# Patient Record
Sex: Male | Born: 1960 | Race: White | Hispanic: No | Marital: Married | Smoking: Never smoker
Health system: Southern US, Community
[De-identification: ages and names within clinical notes are randomized; demographics above are authoritative.]

## PROBLEM LIST (undated history)

## (undated) DIAGNOSIS — G4733 Obstructive sleep apnea (adult) (pediatric): Secondary | ICD-10-CM

## (undated) DIAGNOSIS — J45909 Unspecified asthma, uncomplicated: Secondary | ICD-10-CM

## (undated) DIAGNOSIS — I10 Essential (primary) hypertension: Secondary | ICD-10-CM

## (undated) HISTORY — PX: OTHER SURGICAL HISTORY: SHX169

## (undated) HISTORY — DX: Unspecified asthma, uncomplicated: J45.909

## (undated) HISTORY — DX: Obstructive sleep apnea (adult) (pediatric): G47.33

---

## 2010-05-07 HISTORY — PX: OTHER SURGICAL HISTORY: SHX169

## 2018-11-21 ENCOUNTER — Other Ambulatory Visit: Payer: Self-pay

## 2018-11-21 ENCOUNTER — Encounter (HOSPITAL_COMMUNITY): Payer: Self-pay

## 2018-11-21 ENCOUNTER — Emergency Department (HOSPITAL_COMMUNITY): Payer: 59

## 2018-11-21 ENCOUNTER — Emergency Department (HOSPITAL_COMMUNITY)
Admission: EM | Admit: 2018-11-21 | Discharge: 2018-11-21 | Disposition: A | Discharge status: Home / Self Care | Payer: 59 | Attending: Emergency Medicine | Admitting: Emergency Medicine

## 2018-11-21 ENCOUNTER — Other Ambulatory Visit: Payer: Self-pay | Admitting: Urology

## 2018-11-21 DIAGNOSIS — R109 Unspecified abdominal pain: Secondary | ICD-10-CM

## 2018-11-21 DIAGNOSIS — I10 Essential (primary) hypertension: Secondary | ICD-10-CM | POA: Insufficient documentation

## 2018-11-21 DIAGNOSIS — N1330 Unspecified hydronephrosis: Secondary | ICD-10-CM

## 2018-11-21 DIAGNOSIS — Z79899 Other long term (current) drug therapy: Secondary | ICD-10-CM | POA: Diagnosis not present

## 2018-11-21 DIAGNOSIS — N202 Calculus of kidney with calculus of ureter: Secondary | ICD-10-CM | POA: Diagnosis not present

## 2018-11-21 DIAGNOSIS — R1032 Left lower quadrant pain: Secondary | ICD-10-CM | POA: Diagnosis present

## 2018-11-21 DIAGNOSIS — N200 Calculus of kidney: Secondary | ICD-10-CM

## 2018-11-21 HISTORY — DX: Essential (primary) hypertension: I10

## 2018-11-21 LAB — CBC WITH DIFFERENTIAL/PLATELET
Abs Immature Granulocytes: 0.06 10*3/uL (ref 0.00–0.07)
Basophils Absolute: 0.1 10*3/uL (ref 0.0–0.1)
Basophils Relative: 1 %
Eosinophils Absolute: 0.2 10*3/uL (ref 0.0–0.5)
Eosinophils Relative: 2 %
HCT: 46.8 % (ref 39.0–52.0)
Hemoglobin: 15.5 g/dL (ref 13.0–17.0)
Immature Granulocytes: 1 %
Lymphocytes Relative: 32 %
Lymphs Abs: 3.3 10*3/uL (ref 0.7–4.0)
MCH: 31.7 pg (ref 26.0–34.0)
MCHC: 33.1 g/dL (ref 30.0–36.0)
MCV: 95.7 fL (ref 80.0–100.0)
Monocytes Absolute: 1.1 10*3/uL — ABNORMAL HIGH (ref 0.1–1.0)
Monocytes Relative: 11 %
Neutro Abs: 5.5 10*3/uL (ref 1.7–7.7)
Neutrophils Relative %: 53 %
Platelets: 287 10*3/uL (ref 150–400)
RBC: 4.89 MIL/uL (ref 4.22–5.81)
RDW: 11.5 % (ref 11.5–15.5)
WBC: 10.2 10*3/uL (ref 4.0–10.5)
nRBC: 0 % (ref 0.0–0.2)

## 2018-11-21 LAB — COMPREHENSIVE METABOLIC PANEL
ALT: 56 U/L — ABNORMAL HIGH (ref 0–44)
AST: 28 U/L (ref 15–41)
Albumin: 4.1 g/dL (ref 3.5–5.0)
Alkaline Phosphatase: 73 U/L (ref 38–126)
Anion gap: 11 (ref 5–15)
BUN: 20 mg/dL (ref 6–20)
CO2: 27 mmol/L (ref 22–32)
Calcium: 9.5 mg/dL (ref 8.9–10.3)
Chloride: 102 mmol/L (ref 98–111)
Creatinine, Ser: 1.75 mg/dL — ABNORMAL HIGH (ref 0.61–1.24)
GFR calc Af Amer: 49 mL/min — ABNORMAL LOW (ref >60)
GFR calc non Af Amer: 43 mL/min — ABNORMAL LOW (ref >60)
Glucose, Bld: 90 mg/dL (ref 70–99)
Potassium: 3.7 mmol/L (ref 3.5–5.1)
Sodium: 140 mmol/L (ref 135–145)
Total Bilirubin: 0.6 mg/dL (ref 0.3–1.2)
Total Protein: 6.5 g/dL (ref 6.5–8.1)

## 2018-11-21 LAB — URINALYSIS, ROUTINE W REFLEX MICROSCOPIC
Bacteria, UA: NONE SEEN
Bilirubin Urine: NEGATIVE
Glucose, UA: NEGATIVE mg/dL
Ketones, ur: NEGATIVE mg/dL
Leukocytes,Ua: NEGATIVE
Nitrite: NEGATIVE
Protein, ur: NEGATIVE mg/dL
Specific Gravity, Urine: 1.014 (ref 1.005–1.030)
pH: 6 (ref 5.0–8.0)

## 2018-11-21 LAB — LIPASE, BLOOD: Lipase: 28 U/L (ref 11–51)

## 2018-11-21 MED ORDER — ONDANSETRON HCL 4 MG PO TABS: 4.0000 mg | ORAL_TABLET | Freq: Four times a day (QID) | ORAL | 0 refills | Status: AC

## 2018-11-21 MED ORDER — HYDROCODONE-ACETAMINOPHEN 5-325 MG PO TABS: 2.0000 | ORAL_TABLET | ORAL | 0 refills | Status: DC | PRN

## 2018-11-21 MED ORDER — SODIUM CHLORIDE 0.9 % IV BOLUS
1000.0000 mL | Freq: Once | INTRAVENOUS | Status: AC
  Administered 2018-11-21: 1000 mL via INTRAVENOUS

## 2018-11-21 NOTE — ED Notes (Signed)
Patient transported to CT 

## 2018-11-21 NOTE — ED Triage Notes (Signed)
Pt stated that Saturday the Lt side of his back began hurting at a 3/10 rate & intermittently the pain will increase to 10/10 & it will cause him to feel sick, he has vomited x2 from the intense pain so far. Denies fever & Hx of kidney stone, he states that the pain will radiate around his ribs & down into his groin area.

## 2018-11-21 NOTE — Discharge Instructions (Addendum)
I have prescribed medication to help with your nausea, please take Zofran as needed.  I have also prescribed Norco 5 mg, take this for severe pain as needed.  You may continue to increase your fluid intake.  Your procedure scheduled for Monday morning at 9:30 AM for lithotripsy.  If you experience any worsening symptoms over the weekend such as vomiting, unable to keep your pain under control with medication or fever please return to the emergency department.

## 2018-11-21 NOTE — Consult Note (Signed)
I have been asked to see the patient by Ronald Ritter, for evaluation and management of left mid-ureteral stone.  History of present illness: [name] seen in the ED today for two weeks of left flank pain.  Patient with intermittent episodes of severe pain and nausea/vomitting.  No fevers/chills.  No dysuria.  No hematuria.  No history of stones.    In ED patient had CT scan showing a 64mm proximal left ureteral stone with hydronephrosis.  Creatinine was noted to be elevated.  He had no evidence of infection.  With pain medication his pain was able to be controlled.  Review of systems: A 12 point comprehensive review of systems was obtained and is negative unless otherwise stated in the history of present illness.  There are no active problems to display for this patient.   No current facility-administered medications on file prior to encounter.    Current Outpatient Medications on File Prior to Encounter  Medication Sig Dispense Refill  . albuterol (VENTOLIN HFA) 108 (90 Base) MCG/ACT inhaler Inhale 2 puffs into the lungs every 6 (six) hours as needed for wheezing or shortness of breath.    Marland Kitchen amLODipine (NORVASC) 10 MG tablet Take 10 mg by mouth daily.    Marland Kitchen ibuprofen (ADVIL) 200 MG tablet Take 400 mg by mouth every 6 (six) hours as needed for moderate pain.    Marland Kitchen losartan (COZAAR) 50 MG tablet Take 50 mg by mouth daily.      Past Medical History:  Diagnosis Date  . Hypertension     Past Surgical History:  Procedure Laterality Date  . Bone spur removal in neck  2012  . CPPP     To assist in his sleep apnea    Social History   Tobacco Use  . Smoking status: Never Smoker  . Smokeless tobacco: Never Used  Substance Use Topics  . Alcohol use: Yes    Alcohol/week: 2.0 standard drinks    Types: 2 Cans of beer per week  . Drug use: Not on file    Family History  Problem Relation Age of Onset  . Diabetes Father     PE: Vitals:   11/21/18 1129 11/21/18 1130 11/21/18 1200  11/21/18 1354  BP:  120/80 126/89 134/86  Pulse: 61 60 61 66  Resp:   19 14  Temp:      TempSrc:      SpO2: 95% 96% 95% 96%  Weight:      Height:       Patient appears to be in no acute distress  patient is alert and oriented x3 Atraumatic normocephalic head No cervical or supraclavicular lymphadenopathy appreciated No increased work of breathing, no audible wheezes/rhonchi Regular sinus rhythm/rate Abdomen is soft, nontender, nondistended, LEFT CVA tenderness Lower extremities are symmetric without appreciable edema Grossly neurologically intact No identifiable skin lesions  Recent Labs    11/21/18 0823  WBC 10.2  HGB 15.5  HCT 46.8   Recent Labs    11/21/18 0823  NA 140  K 3.7  CL 102  CO2 27  GLUCOSE 90  BUN 20  CREATININE 1.75*  CALCIUM 9.5   No results for input(s): LABPT, INR in the last 72 hours. No results for input(s): LABURIN in the last 72 hours. No results found for this or any previous visit.  Imaging: I have independently reviewed his CT scan images and discussed them with the patient.  My findings are in the HPI.  KUB - 70mm stone just  lateral to L4.  Imp: Left obstructing ureteral stone with associated AKI.  Recommendations: We discussed management options including medical expulsion therapy, shockwave lithotripsy, and ureteroscopy. Ultimately, the patient has opted for shock wave lithotripsy. I discussed with the patient the procedure in detail as well as the risk and benefits. The patient is aware that she may need additional procedures. She also is aware of the risks of hematoma and pain.  We will try to get this patient's scheduled as soon as possible.   I have scheduled him for ESWL on Monday.  I went over all the instructions with him.  He will stop by our office on the way home today to pick up the information packet.  The ED provider will give him a prescription for pain medication.  Ronald Ritter

## 2018-11-21 NOTE — ED Provider Notes (Signed)
[facility] EMERGENCY DEPARTMENT Provider Note   CSN: [id number] Arrival date & time: 11/21/18  0740    History   Chief Complaint Chief Complaint  Patient presents with   Back Pain    HPI Ronald Ritter is a 58 y.o. male.     58 y.o male with a PMH of HTN presents to the ED with a chief complaint of left flank pain which began 6 days ago.  Patient describes this pain as a sharp shooting sensation radiating from his left flank onto his left groin.  He reports this pain has been waxing and waning, came back overnight waking him up from his sleep.  No alleviating or exacerbating factors.  Patient was seen at urgent care yesterday, referred to the ED for a CT scan as a likely thought he had a kidney stone.  He reports the pain returns intense, causes him to vomit has vomited twice due to the pain.  He has been taking his wife's muscle relaxers along with NSAIDs to help with his pain, he reports alleviating symptoms with this medication.  He was told there was some microscopic hematuria at urgent care in his urine, does not note his urine to be visibly bloody.  He denies any fevers, diarrhea, dysuria, shortness of breath or chest pain. Of note patient has a past surgical history of multiple hernia repairs.     The history is provided by the patient.  Back Pain Associated symptoms: no abdominal pain, no chest pain, no dysuria and no fever     Past Medical History:  Diagnosis Date   Hypertension     There are no active problems to display for this patient.   Home Medications    Prior to Admission medications   Medication Sig Start Date End Date Taking? Authorizing Provider  albuterol (VENTOLIN HFA) 108 (90 Base) MCG/ACT inhaler Inhale 2 puffs into the lungs every 6 (six) hours as needed for wheezing or shortness of breath.   Yes [provider]  amLODipine (NORVASC) 10 MG tablet Take 10 mg by mouth daily.   Yes [provider]  ibuprofen  (ADVIL) 200 MG tablet Take 400 mg by mouth every 6 (six) hours as needed for moderate pain.   Yes [provider]  losartan (COZAAR) 50 MG tablet Take 50 mg by mouth daily.   Yes [provider]    Family History Family History  Problem Relation Age of Onset   Diabetes Father     Social History Social History   Tobacco Use   Smoking status: Never Smoker   Smokeless tobacco: Never Used  Substance Use Topics   Alcohol use: Yes    Alcohol/week: 2.0 standard drinks    Types: 2 Cans of beer per week   Drug use: Not on file     Allergies   Honey bee treatment [bee venom] and Other   Review of Systems Review of Systems  Constitutional: Negative for chills and fever.  HENT: Negative for ear pain and sore throat.   Eyes: Negative for pain and visual disturbance.  Respiratory: Negative for cough and shortness of breath.   Cardiovascular: Negative for chest pain and palpitations.  Gastrointestinal: Positive for nausea and vomiting. Negative for abdominal pain.  Genitourinary: Positive for flank pain and hematuria. Negative for dysuria.  Musculoskeletal: Positive for back pain. Negative for arthralgias.  Skin: Negative for color change and rash.  Neurological: Negative for seizures and syncope.  All other systems reviewed  and are negative.    Physical Exam Updated Vital Signs BP 126/89 (BP Location: Right Arm)    Pulse 61    Temp 98.1 F (36.7 C) (Oral)    Resp 19    Ht 6\' 1"  (1.854 m)    Wt 104.3 kg    SpO2 95%    BMI 30.34 kg/m   Physical Exam Vitals signs and nursing note reviewed.  Constitutional:      Appearance: He is well-developed.     Comments: Appears in no distress during evaluation.  HENT:     Head: Normocephalic and atraumatic.  Eyes:     General: No scleral icterus.    Pupils: Pupils are equal, round, and reactive to light.  Neck:     Musculoskeletal: Normal range of motion.  Cardiovascular:     Rate and Rhythm: Normal rate.      Heart sounds: Normal heart sounds.  Pulmonary:     Effort: Pulmonary effort is normal.     Breath sounds: Normal breath sounds. No wheezing.     Comments: Lungs are clear to auscultation. Chest:     Chest wall: No tenderness.  Abdominal:     General: Bowel sounds are normal. There is no distension.     Palpations: Abdomen is soft.     Tenderness: There is no abdominal tenderness. There is left CVA tenderness.     Comments: Bowel sounds present, abdomen appears nondistended, nontender.  Mild left CVA noted on exam.  Musculoskeletal:        General: No tenderness or deformity.  Skin:    General: Skin is warm and dry.  Neurological:     Mental Status: He is alert and oriented to person, place, and time.      ED Treatments / Results  Labs (all labs ordered are listed, but only abnormal results are displayed) Labs Reviewed  CBC WITH DIFFERENTIAL/PLATELET - Abnormal; Notable for the following components:      Result Value   Monocytes Absolute 1.1 (*)    All other components within normal limits  COMPREHENSIVE METABOLIC PANEL - Abnormal; Notable for the following components:   Creatinine, Ser 1.75 (*)    ALT 56 (*)    GFR calc non Af Amer 43 (*)    GFR calc Af Amer 49 (*)    All other components within normal limits  URINALYSIS, ROUTINE W REFLEX MICROSCOPIC - Abnormal; Notable for the following components:   Hgb urine dipstick MODERATE (*)    All other components within normal limits  LIPASE, BLOOD    EKG None  Radiology Ct Renal Stone Study  Result Date: 11/21/2018 CLINICAL DATA:  Left-sided flank pain EXAM: CT ABDOMEN AND PELVIS WITHOUT CONTRAST TECHNIQUE: Multidetector CT imaging of the abdomen and pelvis was performed following the standard protocol without oral or IV contrast. COMPARISON:  None. FINDINGS: Lower chest: Lung bases are clear. Hepatobiliary: Liver measures 21.8 cm in length. There is hepatic steatosis. No focal liver lesions are evident on this noncontrast  enhanced study. Gallbladder wall is not appreciably thickened. There is no biliary duct dilatation. Pancreas: No pancreatic mass or inflammatory focus evident. Spleen: No splenic lesions are evident. Adrenals/Urinary Tract: Adrenals bilaterally appear normal. There is a suspected upper pole calyceal diverticulum on the left, measuring approximately 3 x 3 cm. There is a 1.3 x 1.3 cm mass arising from the posterior lower pole of the left kidney with a measured Hounsfield unit of 79, felt to represent a small hyperdense cyst.  There is mild hydronephrosis on the left. There is no hydronephrosis on the right. There is a 4 mm calculus in the lower pole right kidney. There are tiny scattered 1 mm calculi in the right kidney. There are two adjacent 1 mm calculi in the upper pole of the left kidney. There is a calculus in the proximal left ureter slightly beyond the ureteropelvic junction at the L4 level measuring 8 x 6 mm. No other ureteral calculi are evident on either side. Urinary bladder wall thickness is within normal limits for degree of distention. Stomach/Bowel: There is no appreciable bowel wall or mesenteric thickening. There is moderate stool throughout the colon. Terminal ileum appears normal. There is no evident bowel obstruction. There is no free air or portal venous air. Vascular/Lymphatic: There are scattered foci mesenteric artery calcification. No abdominal aortic aneurysm. Multiple pelvic phleboliths noted. No adenopathy is demonstrable in the abdomen or pelvis. Reproductive: Prostate and seminal vesicles are normal in size and contour. There are a few prostatic calculi. No pelvic mass. Other: Appendix appears normal. There is no abscess or ascites in the abdomen or pelvis. Musculoskeletal: There are no appreciable blastic or lytic bone lesions. There are foci of degenerative change in the lower thoracic and lumbar regions. No evident abdominal wall or intramuscular lesions. IMPRESSION: 1. 8 x 6 mm  calculus in the proximal left ureter at the L4 level with mild hydronephrosis on the left. 2. 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 Apparent hyperdense cyst lower pole left kidney laterally measuring 1.3 x 1.3 cm. 3. Small nonobstructing calculi in each kidney. Occasional prostatic calculi noted. 4. No evident bowel obstruction. No abscess in the abdomen or pelvis. Appendix appears normal. 5.  Prominent liver with hepatic steatosis. Electronically Signed   By: Bretta BangWilliam  Woodruff III M.D.   On: 11/21/2018 09:12    Procedures Procedures (including critical care time)  Medications Ordered in ED Medications  sodium chloride 0.9 % bolus 1,000 mL (0 mLs Intravenous Stopped 11/21/18 1129)     Initial Impression / Assessment and Plan / ED Course  I have reviewed the triage vital signs and the nursing notes.  Pertinent labs & imaging results that were available during my care of the patient were reviewed by me and considered in my medical decision making (see chart for details).    Patient with no past medical history presents to the ED with complaints of left flank pain which began 6 days ago.  Was seen in urgent care yesterday, told that he likely had a kidney stone will need further evaluation with CT imaging in the ED.  Has been taking muscle relaxers along with NSAIDs to help with his pain with improvement.  Pain waxes and wanes, currently pain is at a 3, did not want any pain medication at this time.  Will provide him with fluids, screening lab work along with CT renal study to further evaluate this.  Differential diagnosis include but not limited to nephrolithiasis, infected stone, diverticulitis.  CMP showed no electrolyte abnormality.  Mild AKI at 1.75 without any prior levels for comparison.  LFTs show slight increase on ALT at 56.  CBC showed no leukocytosis, hemoglobin is within normal limits.  Lipase is unremarkable.    CT renal study showed: 1. 8 x 6 mm calculus in  the proximal left ureter at the L4 level  with mild hydronephrosis on the left.    2. 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  Apparent  hyperdense cyst lower pole left kidney laterally measuring  1.3 x 1.3 cm.    3. Small nonobstructing calculi in each kidney. Occasional prostatic  calculi noted.    4. No evident bowel obstruction. No abscess in the abdomen or  pelvis. Appendix appears normal.    5. Prominent liver with hepatic steatosis.     Will place for for urology recommendations, patient's pain is controlled right now without any therapy while in the ED.  Did receive a liter of fluids.  10:55 AM Spoke to Dr. Hyman HopesWebb of nephrology per patient's request, he has been updated on his case as his wife is currently employed with him. Will waiting on urology recommendations.   12:12 PM Spoke to Dr. Marlou PorchHerrick of Urology who reviewed CT imaging and can schedule patient for lithotripsy on Monday.  He requested a KUB be order, order in place.  He will come into the ED to further discuss treatment and procedure with patient.  Patient updated on this plan.  Patient is to be sent home with Zofran along with Norco to help with his symptoms, he is scheduled for lithotripsy at 930 on Monday morning.  Patient understands and agrees with plan.  With unremarkable vitals, stable for discharge.  Return precautions discussed at length.   Portions of this note were generated with Scientist, clinical (histocompatibility and immunogenetics)Dragon dictation software. Dictation errors may occur despite best attempts at proofreading.   Final Clinical Impressions(s) / ED Diagnoses   Final diagnoses:  Left flank pain  Left nephrolithiasis  Hydronephrosis of left kidney    ED Discharge Orders    None       Claude MangesSoto, Ollen Rao, PA-C 11/21/18 1402    Pricilla LovelessGoldston, Scott, MD 11/25/18 [phone]

## 2018-11-21 NOTE — H&P (View-Only) (Signed)
I have been asked to see the patient by Dr. Sherwood Gambler, for evaluation and management of left mid-ureteral stone.  History of present illness: [name] seen in the ED today for two weeks of left flank pain.  Patient with intermittent episodes of severe pain and nausea/vomitting.  No fevers/chills.  No dysuria.  No hematuria.  No history of stones.    In ED patient had CT scan showing a 64mm proximal left ureteral stone with hydronephrosis.  Creatinine was noted to be elevated.  He had no evidence of infection.  With pain medication his pain was able to be controlled.  Review of systems: A 12 point comprehensive review of systems was obtained and is negative unless otherwise stated in the history of present illness.  There are no active problems to display for this patient.   No current facility-administered medications on file prior to encounter.    Current Outpatient Medications on File Prior to Encounter  Medication Sig Dispense Refill  . albuterol (VENTOLIN HFA) 108 (90 Base) MCG/ACT inhaler Inhale 2 puffs into the lungs every 6 (six) hours as needed for wheezing or shortness of breath.    Marland Kitchen amLODipine (NORVASC) 10 MG tablet Take 10 mg by mouth daily.    Marland Kitchen ibuprofen (ADVIL) 200 MG tablet Take 400 mg by mouth every 6 (six) hours as needed for moderate pain.    Marland Kitchen losartan (COZAAR) 50 MG tablet Take 50 mg by mouth daily.      Past Medical History:  Diagnosis Date  . Hypertension     Past Surgical History:  Procedure Laterality Date  . Bone spur removal in neck  2012  . CPPP     To assist in his sleep apnea    Social History   Tobacco Use  . Smoking status: Never Smoker  . Smokeless tobacco: Never Used  Substance Use Topics  . Alcohol use: Yes    Alcohol/week: 2.0 standard drinks    Types: 2 Cans of beer per week  . Drug use: Not on file    Family History  Problem Relation Age of Onset  . Diabetes Father     PE: Vitals:   11/21/18 1129 11/21/18 1130 11/21/18 1200  11/21/18 1354  BP:  120/80 126/89 134/86  Pulse: 61 60 61 66  Resp:   19 14  Temp:      TempSrc:      SpO2: 95% 96% 95% 96%  Weight:      Height:       Patient appears to be in no acute distress  patient is alert and oriented x3 Atraumatic normocephalic head No cervical or supraclavicular lymphadenopathy appreciated No increased work of breathing, no audible wheezes/rhonchi Regular sinus rhythm/rate Abdomen is soft, nontender, nondistended, LEFT CVA tenderness Lower extremities are symmetric without appreciable edema Grossly neurologically intact No identifiable skin lesions  Recent Labs    11/21/18 0823  WBC 10.2  HGB 15.5  HCT 46.8   Recent Labs    11/21/18 0823  NA 140  K 3.7  CL 102  CO2 27  GLUCOSE 90  BUN 20  CREATININE 1.75*  CALCIUM 9.5   No results for input(s): LABPT, INR in the last 72 hours. No results for input(s): LABURIN in the last 72 hours. No results found for this or any previous visit.  Imaging: I have independently reviewed his CT scan images and discussed them with the patient.  My findings are in the HPI.  KUB - 70mm stone just  lateral to L4.  Imp: Left obstructing ureteral stone with associated AKI.  Recommendations: We discussed management options including medical expulsion therapy, shockwave lithotripsy, and ureteroscopy. Ultimately, the patient has opted for shock wave lithotripsy. I discussed with the patient the procedure in detail as well as the risk and benefits. The patient is aware that she may need additional procedures. She also is aware of the risks of hematoma and pain.  We will try to get this patient's scheduled as soon as possible.   I have scheduled him for ESWL on Monday.  I went over all the instructions with him.  He will stop by our office on the way home today to pick up the information packet.  The ED provider will give him a prescription for pain medication.  Ivett Luebbe W Kaiyon Hynes  

## 2018-11-22 ENCOUNTER — Other Ambulatory Visit: Payer: Self-pay | Admitting: Urology

## 2018-11-22 DIAGNOSIS — N200 Calculus of kidney: Secondary | ICD-10-CM

## 2018-11-22 MED ORDER — HYDROCODONE-ACETAMINOPHEN 5-325 MG PO TABS: 1.0000 | ORAL_TABLET | ORAL | 0 refills | Status: DC | PRN

## 2018-11-23 ENCOUNTER — Other Ambulatory Visit: Payer: Self-pay | Admitting: Urology

## 2018-11-23 MED ORDER — HYDROCODONE-ACETAMINOPHEN 5-325 MG PO TABS: 1.0000 | ORAL_TABLET | ORAL | 0 refills | Status: DC | PRN

## 2018-11-24 ENCOUNTER — Ambulatory Visit (HOSPITAL_COMMUNITY): Payer: 59

## 2018-11-24 ENCOUNTER — Other Ambulatory Visit: Payer: Self-pay

## 2018-11-24 ENCOUNTER — Encounter (HOSPITAL_COMMUNITY)
Admission: RE | Disposition: A | Discharge status: Home / Self Care | Payer: Self-pay | Source: Home / Self Care | Attending: Urology

## 2018-11-24 ENCOUNTER — Encounter (HOSPITAL_COMMUNITY): Payer: Self-pay

## 2018-11-24 ENCOUNTER — Ambulatory Visit (HOSPITAL_COMMUNITY)
Admission: RE | Admit: 2018-11-24 | Discharge: 2018-11-24 | Disposition: A | Discharge status: Home / Self Care | Payer: 59 | Attending: Urology | Admitting: Urology

## 2018-11-24 DIAGNOSIS — I10 Essential (primary) hypertension: Secondary | ICD-10-CM | POA: Diagnosis not present

## 2018-11-24 DIAGNOSIS — N132 Hydronephrosis with renal and ureteral calculous obstruction: Secondary | ICD-10-CM | POA: Insufficient documentation

## 2018-11-24 DIAGNOSIS — N179 Acute kidney failure, unspecified: Secondary | ICD-10-CM | POA: Diagnosis not present

## 2018-11-24 DIAGNOSIS — R109 Unspecified abdominal pain: Secondary | ICD-10-CM | POA: Diagnosis present

## 2018-11-24 DIAGNOSIS — N201 Calculus of ureter: Secondary | ICD-10-CM

## 2018-11-24 HISTORY — PX: EXTRACORPOREAL SHOCK WAVE LITHOTRIPSY: SHX1557

## 2018-11-24 SURGERY — LITHOTRIPSY, ESWL
Anesthesia: LOCAL | Laterality: Left

## 2018-11-24 MED ORDER — SODIUM CHLORIDE 0.9 % IV SOLN: 250.0000 mL | INTRAVENOUS | Status: DC | PRN

## 2018-11-24 MED ORDER — SODIUM CHLORIDE 0.9 % IV SOLN
INTRAVENOUS | Status: DC
  Administered 2018-11-24: 11:00:00 via INTRAVENOUS

## 2018-11-24 MED ORDER — DIAZEPAM 5 MG PO TABS
10.0000 mg | ORAL_TABLET | ORAL | Status: AC
  Administered 2018-11-24: 10 mg via ORAL
  Filled 2018-11-24: qty 2

## 2018-11-24 MED ORDER — SODIUM CHLORIDE 0.9% FLUSH: 3.0000 mL | INTRAVENOUS | Status: DC | PRN

## 2018-11-24 MED ORDER — ACETAMINOPHEN 325 MG PO TABS: 650.0000 mg | ORAL_TABLET | ORAL | Status: DC | PRN

## 2018-11-24 MED ORDER — MORPHINE SULFATE (PF) 4 MG/ML IV SOLN
2.0000 mg | INTRAVENOUS | Status: DC | PRN
  Administered 2018-11-24: 2 mg via INTRAVENOUS

## 2018-11-24 MED ORDER — MORPHINE SULFATE (PF) 4 MG/ML IV SOLN
INTRAVENOUS | Status: AC
  Filled 2018-11-24: qty 1

## 2018-11-24 MED ORDER — ACETAMINOPHEN 650 MG RE SUPP
650.0000 mg | RECTAL | Status: DC | PRN
  Filled 2018-11-24: qty 1

## 2018-11-24 MED ORDER — OXYCODONE HCL 5 MG PO TABS
ORAL_TABLET | ORAL | Status: AC
  Filled 2018-11-24: qty 2

## 2018-11-24 MED ORDER — DIPHENHYDRAMINE HCL 25 MG PO CAPS
25.0000 mg | ORAL_CAPSULE | ORAL | Status: AC
  Administered 2018-11-24: 25 mg via ORAL
  Filled 2018-11-24: qty 1

## 2018-11-24 MED ORDER — CIPROFLOXACIN HCL 500 MG PO TABS
500.0000 mg | ORAL_TABLET | ORAL | Status: AC
  Administered 2018-11-24: 11:00:00 500 mg via ORAL
  Filled 2018-11-24: qty 1

## 2018-11-24 MED ORDER — ACETAMINOPHEN 10 MG/ML IV SOLN: 1000.0000 mg | Freq: Four times a day (QID) | INTRAVENOUS | Status: DC

## 2018-11-24 MED ORDER — OXYCODONE-ACETAMINOPHEN 5-325 MG PO TABS: 1.0000 | ORAL_TABLET | ORAL | 0 refills | Status: DC | PRN

## 2018-11-24 MED ORDER — ACETAMINOPHEN 10 MG/ML IV SOLN
INTRAVENOUS | Status: AC
  Filled 2018-11-24: qty 100

## 2018-11-24 MED ORDER — OXYCODONE HCL 5 MG PO TABS
5.0000 mg | ORAL_TABLET | ORAL | Status: DC | PRN
  Administered 2018-11-24: 10 mg via ORAL

## 2018-11-24 MED ORDER — SODIUM CHLORIDE 0.9% FLUSH: 3.0000 mL | Freq: Two times a day (BID) | INTRAVENOUS | Status: DC

## 2018-11-24 NOTE — Progress Notes (Signed)
PACU Phase II  Pt is in pain 10/10 despite 2x 5 Oxy IR and 2 mg morphine IV. Spoke to Dr Jeffie Pollock, got verbal order for 1000 mg Tylenol IV.   Tylenol in route. Pt appears in severe pain pacing in room ,

## 2018-11-24 NOTE — Discharge Instructions (Signed)

## 2018-11-24 NOTE — Interval H&P Note (Signed)
History and Physical Interval Note:  No change in stone position.  He needs pain med.   11/24/2018 11:30 AM  Ronald Ritter  has presented today for surgery, with the diagnosis of LEFT URETERAL STONE.  The various methods of treatment have been discussed with the patient and family. After consideration of risks, benefits and other options for treatment, the patient has consented to  Procedure(s): EXTRACORPOREAL SHOCK WAVE LITHOTRIPSY (ESWL) (Left) as a surgical intervention.  The patient's history has been reviewed, patient examined, no change in status, stable for surgery.  I have reviewed the patient's chart and labs.  Questions were answered to the patient's satisfaction.     Irine Seal

## 2018-11-25 ENCOUNTER — Encounter (HOSPITAL_COMMUNITY): Payer: Self-pay | Admitting: Urology

## 2019-11-29 IMAGING — CR ABDOMEN - 1 VIEW
2 series · 2 of 2 positions shown · non-contrast
Comparison: CT scan November 21, 2018

CLINICAL DATA: Preoperative study prior to lithotripsy

EXAM:
ABDOMEN - 1 VIEW

[t abdomen supine (1 of 2)]
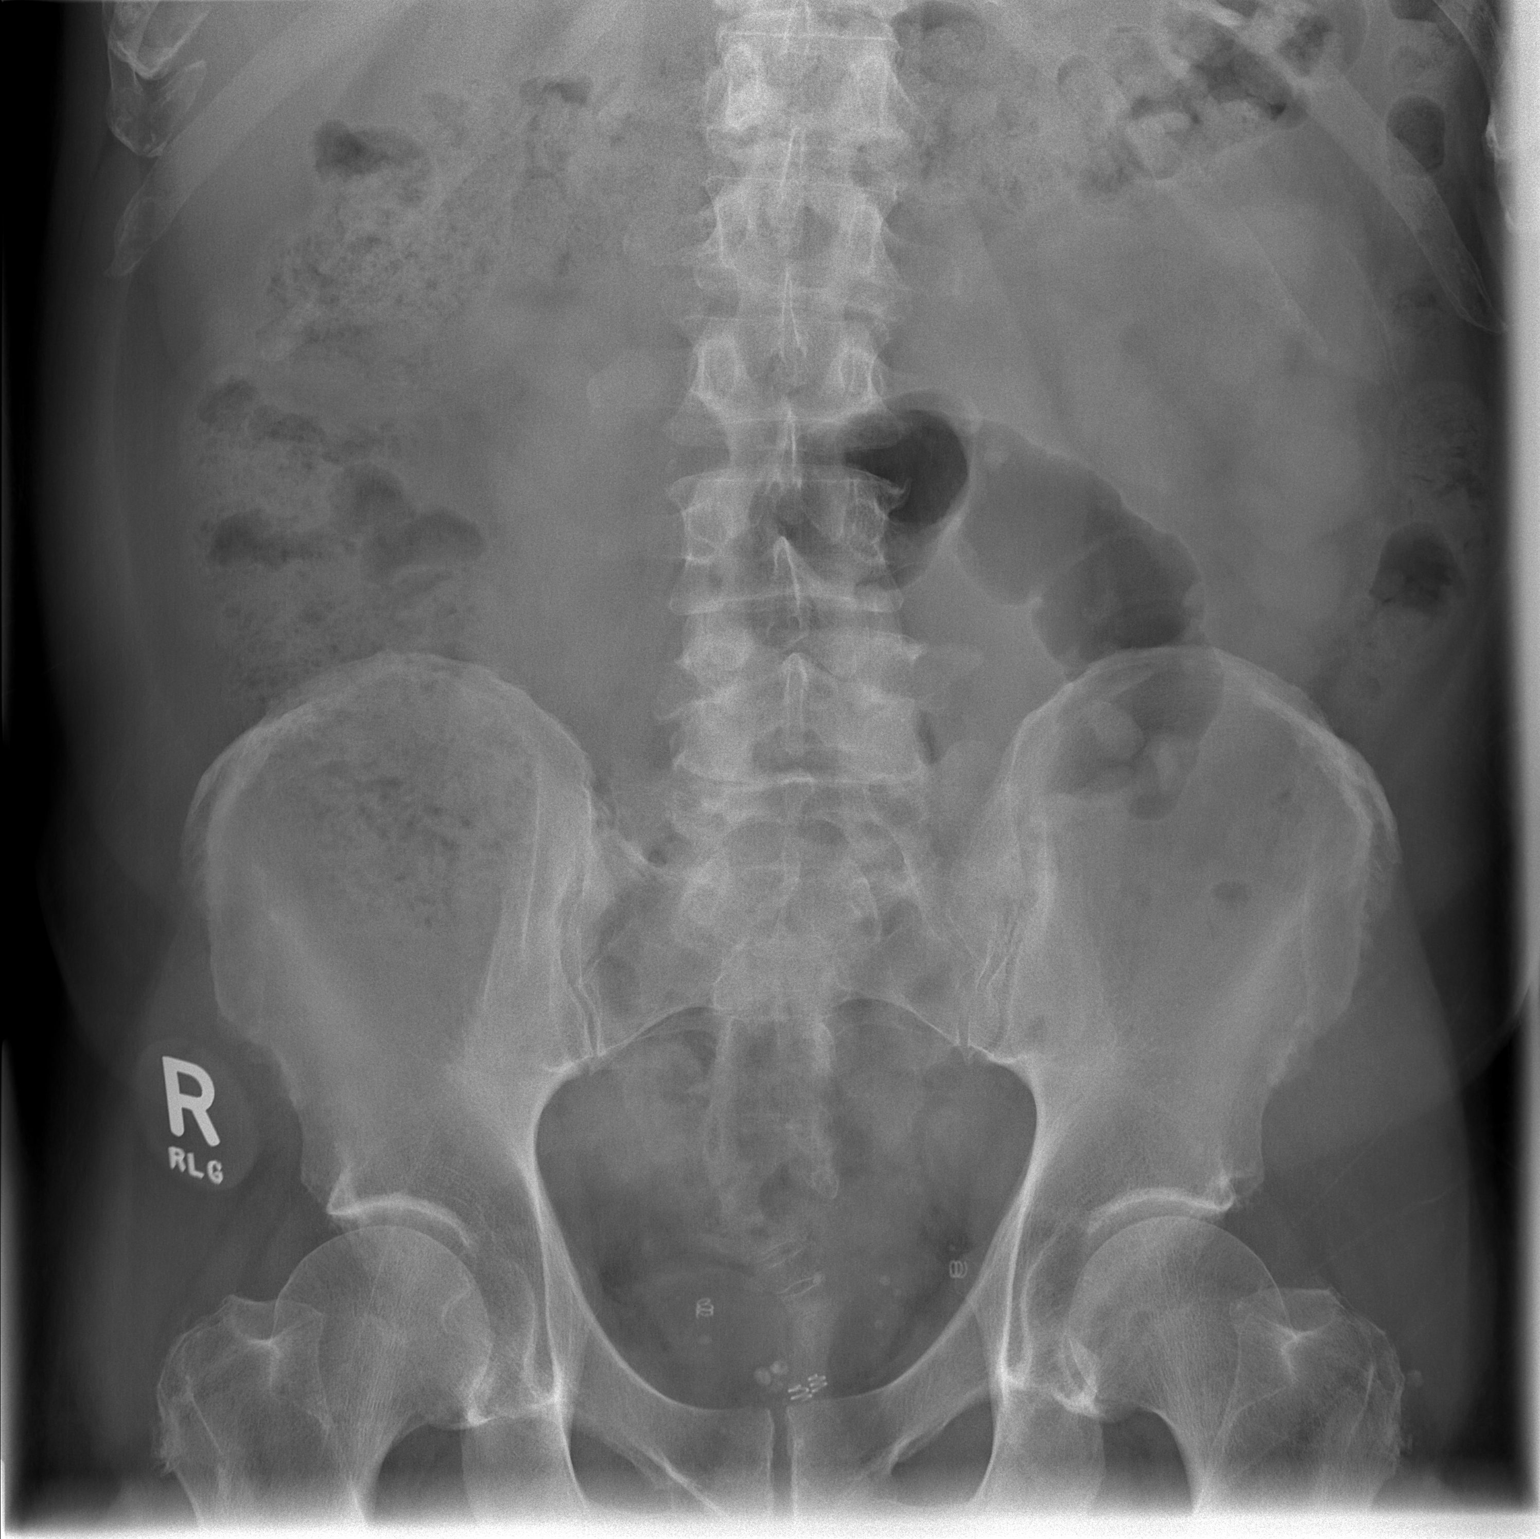

[t abdomen supine (2 of 2)]
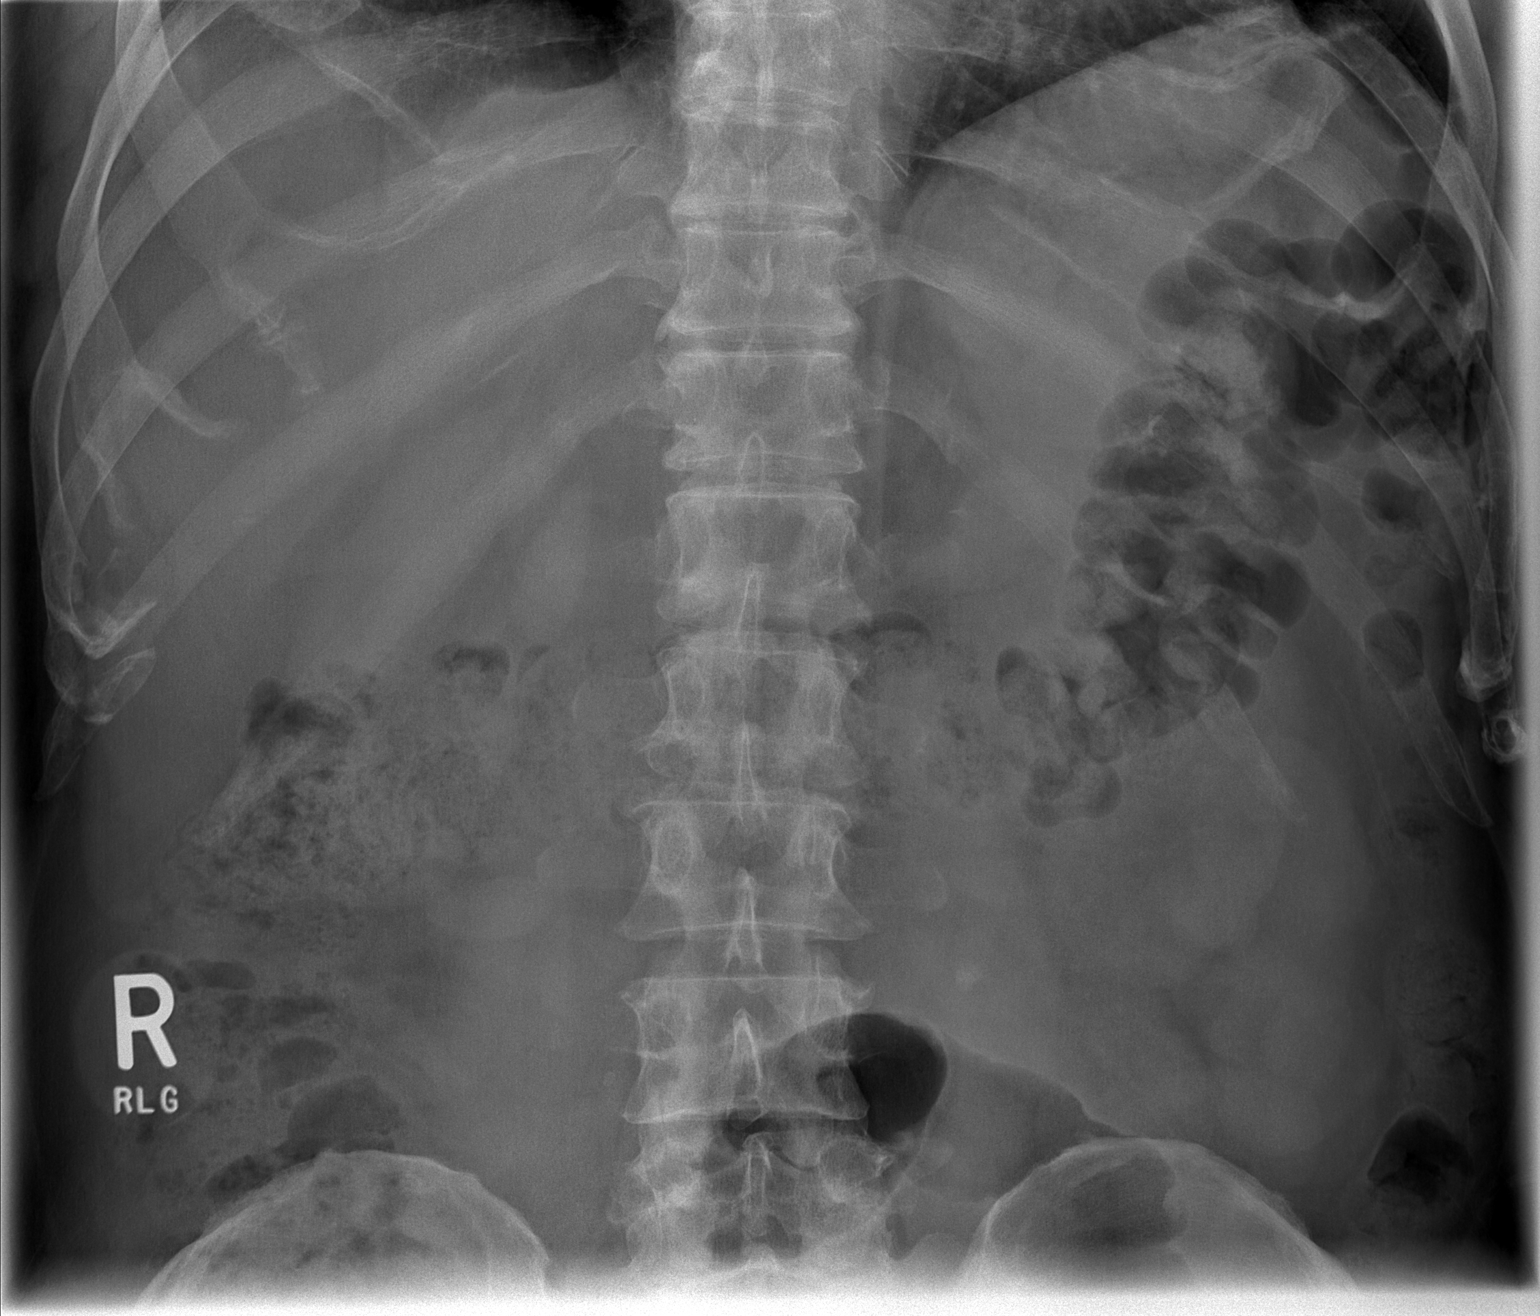

[2 of 2 positions shown; findings below may reference images not displayed]

FINDINGS: The stone in the proximal left ureter is again identified at the
L3-4 level, similar in position. Phleboliths in the pelvis. No other
acute abnormalities.
IMPRESSION: Persistent stone measuring at least 9 mm in the proximal left
ureter.

## 2021-03-07 ENCOUNTER — Encounter: Payer: Self-pay | Admitting: Allergy

## 2021-03-07 ENCOUNTER — Ambulatory Visit: Payer: No Typology Code available for payment source | Admitting: Allergy

## 2021-03-07 ENCOUNTER — Other Ambulatory Visit: Payer: Self-pay

## 2021-03-07 VITALS — BP 138/82 | HR 60 | Ht 72.5 in | Wt 237.0 lb

## 2021-03-07 DIAGNOSIS — J4540 Moderate persistent asthma, uncomplicated: Secondary | ICD-10-CM

## 2021-03-07 DIAGNOSIS — J3089 Other allergic rhinitis: Secondary | ICD-10-CM | POA: Diagnosis not present

## 2021-03-07 MED ORDER — MONTELUKAST SODIUM 10 MG PO TABS: 10.0000 mg | ORAL_TABLET | Freq: Every day | ORAL | 5 refills | Status: AC

## 2021-03-07 MED ORDER — LEVOCETIRIZINE DIHYDROCHLORIDE 5 MG PO TABS: 5.0000 mg | ORAL_TABLET | Freq: Every day | ORAL | 5 refills | Status: AC | PRN

## 2021-03-07 NOTE — Patient Instructions (Addendum)
-  have access to albuterol inhaler 2 puffs every 4-6 hours as needed for cough/wheeze/shortness of breath/chest tightness.  May use 15-20 minutes prior to activity.   Monitor frequency of use.   -continue Trelegy 1 puff daily -recommend starting Singulair 10mg  daily at bedtime.  Singulair is an anti-luekotriene that can be helpful in allergy and asthma symptom control.  It can work alongside anti-histamines for better effect.  If you notice any change in mood/behavior/sleep after starting Singulair then stop this medication and let know.  Symptoms resolve after stopping the medication.    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  Xyzal is a long-acting antihistamine that may be more effective than Zyrtec -for nasal congestion and drainage Ryaltris 2 sprays each twice a day  -allergen immunotherapy discussed today including protocol, benefits and risk.  Informational handout provided. If medication management is not effective enough and you have continued mold exposure then immunotherapy will be helpful long-term  Follow-up in 3-4 months or sooner if needed
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
## 2021-03-09 ENCOUNTER — Telehealth: Payer: Self-pay | Admitting: Allergy

## 2021-03-09 NOTE — Telephone Encounter (Signed)
Patient is requesting a cheaper alternative for levocetirizine.  He went to pick up his prescriptions at the pharmacy and the cost for it was over $200.   Best pharmacy- [facility] on [address]

## 2021-03-13 NOTE — Telephone Encounter (Signed)
I did confirm with the pharmacy, the $200 is after running it through insurance and it did not prompt for a PA. I spoke with Merton Border and told him to just come by the office and pick up samples so we can see if it will be helpful.

## 2021-07-11 ENCOUNTER — Ambulatory Visit: Payer: No Typology Code available for payment source | Admitting: Allergy
